# Patient Record
Sex: Male | Born: 1985 | Race: White | Hispanic: No | Marital: Married | Smoking: Never smoker
Health system: Southern US, Community
[De-identification: ages and names within clinical notes are randomized; demographics above are authoritative.]

## PROBLEM LIST (undated history)

## (undated) DIAGNOSIS — F419 Anxiety disorder, unspecified: Secondary | ICD-10-CM

## (undated) DIAGNOSIS — F32A Depression, unspecified: Secondary | ICD-10-CM

## (undated) DIAGNOSIS — F329 Major depressive disorder, single episode, unspecified: Secondary | ICD-10-CM

## (undated) HISTORY — DX: Anxiety disorder, unspecified: F41.9

## (undated) HISTORY — DX: Depression, unspecified: F32.A

## (undated) HISTORY — DX: Major depressive disorder, single episode, unspecified: F32.9

## (undated) HISTORY — PX: HERNIA REPAIR: SHX51

---

## 2014-08-19 ENCOUNTER — Emergency Department (HOSPITAL_COMMUNITY): Payer: BLUE CROSS/BLUE SHIELD

## 2014-08-19 ENCOUNTER — Encounter (HOSPITAL_COMMUNITY): Payer: Self-pay | Admitting: Emergency Medicine

## 2014-08-19 ENCOUNTER — Emergency Department (HOSPITAL_COMMUNITY)
Admission: EM | Admit: 2014-08-19 | Discharge: 2014-08-19 | Disposition: A | Discharge status: Home / Self Care | Payer: BLUE CROSS/BLUE SHIELD | Attending: Emergency Medicine | Admitting: Emergency Medicine

## 2014-08-19 DIAGNOSIS — Z7952 Long term (current) use of systemic steroids: Secondary | ICD-10-CM | POA: Diagnosis not present

## 2014-08-19 DIAGNOSIS — Z792 Long term (current) use of antibiotics: Secondary | ICD-10-CM | POA: Diagnosis not present

## 2014-08-19 DIAGNOSIS — J40 Bronchitis, not specified as acute or chronic: Secondary | ICD-10-CM | POA: Diagnosis not present

## 2014-08-19 DIAGNOSIS — Z79899 Other long term (current) drug therapy: Secondary | ICD-10-CM | POA: Diagnosis not present

## 2014-08-19 DIAGNOSIS — R0602 Shortness of breath: Secondary | ICD-10-CM | POA: Diagnosis present

## 2014-08-19 MED ORDER — ALBUTEROL SULFATE (2.5 MG/3ML) 0.083% IN NEBU
5.0000 mg | INHALATION_SOLUTION | Freq: Once | RESPIRATORY_TRACT | Status: AC
  Administered 2014-08-19: 5 mg via RESPIRATORY_TRACT
  Filled 2014-08-19: qty 6

## 2014-08-19 MED ORDER — NAPROXEN 500 MG PO TABS: 500.0000 mg | ORAL_TABLET | Freq: Two times a day (BID) | ORAL | Status: DC

## 2014-08-19 MED ORDER — ALBUTEROL SULFATE HFA 108 (90 BASE) MCG/ACT IN AERS
2.0000 | INHALATION_SPRAY | RESPIRATORY_TRACT | Status: DC
  Filled 2014-08-19: qty 6.7

## 2014-08-19 MED ORDER — IPRATROPIUM-ALBUTEROL 0.5-2.5 (3) MG/3ML IN SOLN
3.0000 mL | Freq: Once | RESPIRATORY_TRACT | Status: AC
  Administered 2014-08-19: 3 mL via RESPIRATORY_TRACT

## 2014-08-19 MED ORDER — HYDROCODONE-ACETAMINOPHEN 5-325 MG PO TABS
1.0000 | ORAL_TABLET | Freq: Once | ORAL | Status: AC
  Administered 2014-08-19: 1 via ORAL
  Filled 2014-08-19: qty 1

## 2014-08-19 NOTE — ED Provider Notes (Signed)
CSN: [id number]     Arrival date & time 08/19/14  0044 History   First MD Initiated Contact with Patient 08/19/14 0127     Chief Complaint  Patient presents with  . Shortness of Breath   (Consider location/radiation/quality/duration/timing/severity/associated sxs/prior Treatment) HPI  Jordan Hunt is a 29 yo male presenting with report of chest tightness and shortness of breath.  He states he was diagnosed with bronchitis last week after having cough and wheezing.  He was treated with prednisone and antibiotics but has continued to have wheezing worse with exercise and coughing worse at night.  He denies any fevers, chills, muscle aches, chest pain, diaphoresis, hemoptysis, recent surgery, or unilateral leg swelling or pain.   History reviewed. No pertinent past medical history. History reviewed. No pertinent past surgical history. No family history on file. History  Substance Use Topics  . Smoking status: Never Smoker   . Smokeless tobacco: Not on file  . Alcohol Use: No    Review of Systems  Constitutional: Negative for fever and chills.  HENT: Negative for sore throat.   Eyes: Negative for visual disturbance.  Respiratory: Positive for cough, chest tightness, shortness of breath and wheezing.   Cardiovascular: Negative for chest pain and leg swelling.  Gastrointestinal: Negative for nausea, vomiting and diarrhea.  Genitourinary: Negative for dysuria.  Musculoskeletal: Negative for myalgias.  Skin: Negative for rash.  Neurological: Negative for weakness, numbness and headaches.      Allergies  Review of patient's allergies indicates no known allergies.  Home Medications   Prior to Admission medications   Medication Sig Start Date End Date Taking? Authorizing Provider  Ascorbic Acid (VITAMIN C ER PO) Take 1 tablet by mouth daily.   Yes Historical Provider, MD  azithromycin (ZITHROMAX) 250 MG tablet Take 250 mg by mouth daily.   Yes Historical Provider, MD  Linoleic  Acid-Sunflower Oil (CLA PO) Take 1 tablet by mouth daily.   Yes Historical Provider, MD  Multiple Vitamin (MULTIVITAMIN WITH MINERALS) TABS tablet Take 1 tablet by mouth daily.   Yes Historical Provider, MD  Omega-3 Fatty Acids (FISH OIL EXTRA STRENGTH PO) Take 1 capsule by mouth daily.   Yes Historical Provider, MD  predniSONE (DELTASONE) 10 MG tablet Take 10 mg by mouth daily with breakfast.   Yes Historical Provider, MD   BP 123/78 mmHg  Pulse 57  Temp(Src) 97.6 F (36.4 C) (Oral)  Resp 18  Ht 5\' 8"  (1.727 m)  Wt 178 lb (80.74 kg)  BMI 27.07 kg/m2  SpO2 98% Physical Exam  Constitutional: He appears well-developed and well-nourished. No distress.  HENT:  Head: Normocephalic and atraumatic.  Mouth/Throat: Oropharynx is clear and moist.  Eyes: Conjunctivae are normal. Right eye exhibits no discharge. Left eye exhibits no discharge. No scleral icterus.  Neck: Neck supple. No thyromegaly present.  Cardiovascular: Normal rate, regular rhythm and intact distal pulses.   Pulmonary/Chest: Effort normal. No respiratory distress. He has no decreased breath sounds. He has wheezes in the left middle field and the left lower field. He has no rhonchi. He has no rales. He exhibits no tenderness.  Abdominal: Soft. There is no tenderness.  Musculoskeletal: He exhibits no tenderness.  Lymphadenopathy:    He has no cervical adenopathy.  Neurological: He is alert. Coordination normal.  Skin: Skin is warm and dry. No rash noted. He is not diaphoretic.  Psychiatric: He has a normal mood and affect.  Nursing note and vitals reviewed.   ED Course  Procedures (including critical  care time) Labs Review Labs Reviewed - No data to display  Imaging Review Dg Chest 2 View (if Patient Has Fever And/or Copd)  08/19/2014   CLINICAL DATA:  Labored breathing and chest pressure, onset tonight. Previously seen on 08/13/2014 for similar symptoms.  EXAM: CHEST  2 VIEW  COMPARISON:  None.  FINDINGS: The heart size  and mediastinal contours are within normal limits. Both lungs are clear. The visualized skeletal structures are unremarkable.  IMPRESSION: No active cardiopulmonary disease.   Electronically Signed   By: Burman NievesWilliam  Stevens M.D.   On: 08/19/2014 01:39     EKG Interpretation   Date/Time:  Monday Aug 19 2014 01:02:34 EDT Ventricular Rate:  58 PR Interval:  177 QRS Duration: 106 QT Interval:  429 QTC Calculation: 421 R Axis:   96 Text Interpretation:  Sinus rhythm Borderline right axis deviation Early  repolarization No old tracing to compare Confirmed by [facility] Healthcare[facility]  MD, DAVID  ([id number]) on 08/19/2014 1:05:41 AM      MDM   Final diagnoses:  Bronchitis   29 yo with symptoms consistent with bronchitis, likely viral as he has been treated with course of azithromycin with no significant improvement. He has no constitutional symptoms, but feels tightness in his chest worse at night. His CXR is negative for acute infiltrate. Pt will be discharged with Albuterol MDI with instructions to use 2 puffs every 4 hours as need for cough or wheezing.  Pt is well-appearing, in no acute distress and vital signs reviewed and not concerning. He appears safe to be discharged.  Discharge include resources to follow-up with a PCP.  Return precautions provided. Pt aware of plan and in agreement. Verbalizes understanding and is agreeable with plan.    Filed Vitals:   08/19/14 0050 08/19/14 0304  BP: 123/78 114/71  Pulse: 57 60  Temp: 97.6 F (36.4 C) 97.7 F (36.5 C)  TempSrc: Oral Oral  Resp: 18 18  Height: 5\' 8"  (1.727 m)   Weight: 178 lb (80.74 kg)   SpO2: 98% 95%   Meds given in ED:  Medications  ipratropium-albuterol (DUONEB) 0.5-2.5 (3) MG/3ML nebulizer solution 3 mL (3 mLs Nebulization Given 08/19/14 0107)  albuterol (PROVENTIL) (2.5 MG/3ML) 0.083% nebulizer solution 5 mg (5 mg Nebulization Given 08/19/14 0316)  HYDROcodone-acetaminophen (NORCO/VICODIN) 5-325 MG per tablet 1 tablet (1 tablet Oral Given  08/19/14 0315)    Discharge Medication List as of 08/19/2014  3:32 AM    START taking these medications   Details  naproxen (NAPROSYN) 500 MG tablet Take 1 tablet (500 mg total) by mouth 2 (two) times daily., Starting 08/19/2014, Until Discontinued, Print           Harle BattiestElizabeth Cipriano [place], NP 08/20/14 [id number]  Dione Boozeavid Glick, MD 08/20/14 [phone]

## 2014-08-19 NOTE — Discharge Instructions (Signed)
Please follow directions provided. Be sure to use the resource guide or the referral given to establish care with a primary care doctor. Please use your inhaler 2 puffs every 4 hours as needed for cough, wheezing. Use the naproxen twice a day to help with pain and inflammation. Don't hesitate to return for any new, worsening, or concerning symptoms.    SEEK IMMEDIATE MEDICAL CARE IF:  You feel little or no relief with your inhalers. You are still wheezing and feeling shortness of breath, tightness in your chest, or both.  You have dizziness, headaches, or a fast heart rate.  You have chills, fever, or night sweats.  There is a noticeable increase in phlegm production, or there is blood in the phlegm.   Emergency Department Resource Guide 1) Find a Doctor and Pay Out of Pocket Although you won't have to find out who is covered by your insurance plan, it is a good idea to ask around and get recommendations. You will then need to call the office and see if the doctor you have chosen will accept you as a new patient and what types of options they offer for patients who are self-pay. Some doctors offer discounts or will set up payment plans for their patients who do not have insurance, but you will need to ask so you aren't surprised when you get to your appointment.  2) Contact Your Local Health Department Not all health departments have doctors that can see patients for sick visits, but many do, so it is worth a call to see if yours does. If you don't know where your local health department is, you can check in your phone book. The CDC also has a tool to help you locate your state's health department, and many state websites also have listings of all of their local health departments.  3) Find a Walk-in Clinic If your illness is not likely to be very severe or complicated, you may want to try a walk in clinic. These are popping up all over the country in pharmacies, drugstores, and shopping centers.  They're usually staffed by nurse practitioners or physician assistants that have been trained to treat common illnesses and complaints. They're usually fairly quick and inexpensive. However, if you have serious medical issues or chronic medical problems, these are probably not your best option.  No Primary Care Doctor: - Call Health Connect at  [phone] - they can help you locate a primary care doctor that  accepts your insurance, provides certain services, etc. - Physician Referral Service- [phone]  Chronic Pain Problems: Organization         Address  Phone   Notes  [facility]  [phone] Patients need to be referred by their primary care doctor.   Medication Assistance: Organization         Address  Phone   Notes  [facility] [address] [place], [place] [id number] [phone] --Must be a resident of [place] -- Must have NO insurance coverage whatsoever (no Medicaid/ Medicare, etc.) -- The pt. MUST have a primary care doctor that directs their care regularly and follows them in the community   MedAssist  [phone]   [employer]  [phone]    Agencies that provide inexpensive medical care: Organization         Address  Phone   Notes  [facility]  [phone]   [facility]    (  [phone]   [facility] [place], [place] [address] [phone]   [place] [address] [place] [phone]   Planned Parenthood    [phone]   [facility]    [phone]   [place] and [address], [place] Phone:  [phone], Fax:  [phone] Hours of Operation:  9 am - 6 pm, M-F.  Also accepts Medicaid/Medicare and self-pay.  [facility] for [place], [address], [place] Phone: [phone], Fax: [phone]. Hours of Operation:  8:30 am - 5:30 pm, M-F.  Also accepts Medicaid and self-pay.  [facility] [place] [address], [place] Phone: [phone]   [place], [place], [place] [phone], Ext. [phone] Mondays & Thursdays: 7-9 AM.  First 15 patients are seen on a first come, first serve basis.    [place] Providers:  Organization         Address  Phone   Notes  [facility] [address], [place] [phone] Also accepts self-pay patients.  [facility] [id number] [place], [place]  [phone]   [place], [address], [place] [phone]   [facility] [address], [place] [phone]   Lucianne Lei [address], [place]   [phone] Only accepts [place] Access [place] patients after they have their name applied to their card.   Self-Pay (no insurance) in [facility]:  Organization         Address  Phone   Notes  Sickle Cell Patients, [facility] Magazine [phone]   [facility] [place] [phone]   [facility] [place]  [place], [place], [place] [phone]   [facility]/Dr. Osei-Bonsu  [address] [place] or [address], [place] [phone] Phone number for both [place] and [place] locations is the same.  Urgent Medical and [facility] [address], [place] [phone]   [facility] [address] [place] or [address] [phone]   [facility] [address], [place] [phone], phone; [phone], fax Sees patients 1st and 3rd Saturday of every month.  Must not qualify for public or private insurance (i.e.  Medicaid, Medicare, [place] Health Choice, Veterans' Benefits)  Household income should be no more than 200% of the poverty level The clinic cannot treat you if you are pregnant or think you are pregnant  Sexually transmitted diseases are not treated at the clinic.    Dental Care: Organization         Address  Phone  Notes  [facility] of [facility] [place] [phone] Accepts children up to age 54 who are enrolled in [place] or [place]; pregnant women with a Medicaid card; and children who have applied for Medicaid or  Health Choice, but were declined, whose parents can pay a reduced fee at time of service.  [facility] Department of [facility]  [address], [place] [phone] Accepts children up  to age 84 who are enrolled in Medicaid or [place] Health Choice; pregnant women with a Medicaid card; and children who have applied for Medicaid or [place] Health Choice, but were declined, whose parents can pay a reduced fee at time of service.  [place] Adult Dental Access PROGRAM  Fellows [phone] Patients are seen by appointment only. Walk-ins are not accepted. [place] will see patients 9 years of age and older. Monday - Tuesday (8am-5pm) Most Wednesdays (8:30-5pm) $30 per visit, cash only  [facility] Adult Dental Access PROGRAM  [address], [facility] [phone] Patients are seen by appointment only. Walk-ins are not accepted. [place] will see patients 65 years of age and older. One Wednesday Evening (Monthly: Volunteer Based).  $30 per visit, cash only  [place]  [phone] for adults; Children under age 30, call Graduate Pediatric Dentistry at [phone]. Children aged 55-14, please call [phone] to request a pediatric application.  Dental services are provided in all areas of dental care including fillings,  crowns and bridges, complete and partial dentures, implants, gum treatment, root canals, and extractions. Preventive care is also provided. Treatment is provided to both adults and children. Patients are selected via a lottery and there is often a waiting list.   [facility] [address] [place]  [phone] [email or web address]   [facility] [address] [place], [place] [phone], Ext. [phone] Second and Fourth Thursday of each month, opens at 6:30 AM; Clinic ends at 9 AM.  Patients are seen on a first-come first-served basis, and a limited number are seen during each clinic.   [facility]  [address] Danker [place], [place] [phone]   Eligibility Requirements You must have lived in [place], [place], or [place] counties for at least the last three months.   You cannot be eligible for state or federal sponsored [employer], including [employer], [place], or [employer].   You generally cannot be eligible for healthcare insurance through your employer.    How to apply: Eligibility screenings are held every Tuesday and Wednesday afternoon from 1:00 pm until 4:00 pm. You do not need an appointment for the interview!  [facility] [address] [place], [place]   [place]  Brecon Department  [place]  [phone]    Behavioral Health Resources in the Community: Intensive Outpatient Programs Organization         Address  Phone  Notes  [facility] [address] [place], [place] [phone]   [facility] Outpatient [address], [place], [place]   ADS: Alcohol & Drug Svcs [address], [place], [place]   [place] [address]  [place], [place] or [phone]   Substance Abuse  Resources Organization         Address  Phone  Notes  Alcohol and Drug Services  [phone]   [place]  [phone]   [facility] [place]   Chinita Pester  [phone]   Residential & Outpatient Substance Abuse Program  [phone]   Psychological Services Organization         Address  Phone  Notes  [place]  [place]  [place]   [place]  [address] [place] or [phone]    Mobile Crisis Teams Organization         Address  Phone  Notes  [facility], Mobile Crisis Care Unit  [phone]   [facility]  [address] [place], [place]   [place] Levels [address] [place] [phone]    Self-Help/Support Groups Organization         Address  Phone             Notes  [place]. of [place] - variety of support groups  [place] Call for more information  Narcotics Anonymous (NA), [facility] [address], [employer] [place]  2 meetings at this location   Special educational needs teacher         Address  Phone  Notes  [facility] Residential Treatment [place],    [place]  [phone]   [facility]  [address] [place] [phone], [place], [place]   [place], [place] [phone] Admissions: 8am-3pm M-F  Incentives Substance [place] [address],    [place], [place] [phone]   [facility] [address] [place], [place], [place]   [facility] [address],  [place], [place]   [facility] Programs - Intensive Outpatient [address], [place], [place] Walk   [facility] ([place] [address] [place].,  [place], [place] [phone] or [phone]   [facility] (RTS) [address] [place], [place] Accepts Medicaid  [facility] [place] [address],  [place] [phone] Substance Abuse/Addiction Treatment   [facility] Organization         Address  Phone  Notes  [facility]  [phone]   Domenic Schwab, PhD [address] Arlis [place], [place]   [phone] or [phone]   [facility] [place], [place] [phone]   [facility] [address] [place], [place] [phone] Insurance/Medicaid/sponsorship through [facility] and Families [address]                                    [place], [place] [phone] [place] [address], [place] [phone]    Dr. Adele Schilder  [phone]   [facility] Dept. 1) [address], [place] 2) [place] 3)  [place] [address], [place] [phone]  [phone]   [place] [phone] or [phone] (After Hours)

## 2014-08-19 NOTE — ED Notes (Addendum)
Pt from home c/o shortness of breath x 1 week.  He was seen at PCP and given antibiotics and prednisone for 4 days. Pt has expiratory wheeze on left, clear right. Pt NAD.

## 2015-04-09 ENCOUNTER — Telehealth: Payer: Self-pay | Admitting: General Practice

## 2015-04-09 NOTE — Telephone Encounter (Signed)
Spoke with patient at length about ongoing symptoms. Patient states that he is sure that he is having panic attacks and suffered with this in the past from 2008 through 2013 when he took cymbalta. He states he got better and was able to come off the meds. Flared up again on christmas eve and he went to the ER. Advised patient that we would see in the am and if symptoms worsen overnight or he begins to have chest pain to go to the nearest ER.

## 2015-04-10 ENCOUNTER — Telehealth: Payer: Self-pay | Admitting: Family Medicine

## 2015-04-10 ENCOUNTER — Encounter: Payer: Self-pay | Admitting: Family Medicine

## 2015-04-10 ENCOUNTER — Ambulatory Visit (INDEPENDENT_AMBULATORY_CARE_PROVIDER_SITE_OTHER): Payer: BLUE CROSS/BLUE SHIELD | Admitting: Family Medicine

## 2015-04-10 VITALS — BP 131/86 | HR 65 | Temp 97.1°F | Ht 68.0 in | Wt 177.8 lb

## 2015-04-10 DIAGNOSIS — F419 Anxiety disorder, unspecified: Principal | ICD-10-CM

## 2015-04-10 DIAGNOSIS — F418 Other specified anxiety disorders: Secondary | ICD-10-CM | POA: Diagnosis not present

## 2015-04-10 DIAGNOSIS — F329 Major depressive disorder, single episode, unspecified: Secondary | ICD-10-CM

## 2015-04-10 MED ORDER — DULOXETINE HCL 60 MG PO CPEP: 60.0000 mg | ORAL_CAPSULE | Freq: Every day | ORAL | Status: DC

## 2015-04-10 MED ORDER — BUSPIRONE HCL 7.5 MG PO TABS: 7.5000 mg | ORAL_TABLET | Freq: Two times a day (BID) | ORAL | Status: DC

## 2015-04-10 MED ORDER — TRAZODONE HCL 50 MG PO TABS: 25.0000 mg | ORAL_TABLET | Freq: Every evening | ORAL | Status: DC | PRN

## 2015-04-10 NOTE — Progress Notes (Signed)
BP 131/86 mmHg  Pulse 65  Temp(Src) 97.1 F (36.2 C) (Oral)  Ht 5' 8"  (1.727 m)  Wt 177 lb 12.8 oz (80.65 kg)  BMI 27.04 kg/m2   Subjective:    Patient ID: Jordan Hunt, male    DOB: [birth year], 29 y.o.   MRN: [id number]  HPI: Jordan Hunt is a 29 y.o. male presenting on 04/10/2015 for Establish Care   HPI Anxiety depression Patient presents today as a new patient to establish care with our practice and to talk about his anxiety and depression. He admits that he had anxiety and depression is been on medications before. What worked for him before was Cymbalta. He said is been feeling kind of off for the past 2 months and just not himself with more crying and lack of desire and decreased appetite. This culminated over her cyst break when he was up at his parent's house in [place] and he had a panic attack where he thought he was having a heart attack and had to go to the emergency department. Since that time he has been having a lot of issues with anxiety but no panic attacks but he is not able to sleep at night as well. He denies any suicidal ideations or thoughts of hurting himself.  Relevant past medical, surgical, family and social history reviewed and updated as indicated. Interim medical history since our last visit reviewed. Allergies and medications reviewed and updated.  Review of Systems  Constitutional: Negative for fever and chills.  HENT: Negative for ear discharge and ear pain.   Eyes: Negative for discharge and visual disturbance.  Respiratory: Negative for shortness of breath and wheezing.   Cardiovascular: Negative for chest pain and leg swelling.  Gastrointestinal: Negative for abdominal pain, diarrhea and constipation.  Endocrine: Negative for cold intolerance and heat intolerance.  Genitourinary: Negative for difficulty urinating.  Musculoskeletal: Negative for back pain and gait problem.  Skin: Negative for rash.  Neurological: Negative for syncope,  light-headedness and headaches.  Psychiatric/Behavioral: Positive for sleep disturbance, dysphoric mood and decreased concentration. Negative for suicidal ideas, confusion and self-injury. The patient is nervous/anxious.   All other systems reviewed and are negative.   Per HPI unless specifically indicated above     Medication List       This list is accurate as of: 04/10/15 10:09 AM.  Always use your most recent med list.               DULoxetine 60 MG capsule  Commonly known as:  CYMBALTA  Take 1 capsule (60 mg total) by mouth daily.     traZODone 50 MG tablet  Commonly known as:  DESYREL  Take 0.5-1 tablets (25-50 mg total) by mouth at bedtime as needed for sleep.       Past Surgical History  Procedure Laterality Date  . Hernia repair  [id number]   Social History   Social History  . Marital Status: Married    Spouse Name: N/A  . Number of Children: N/A  . Years of Education: N/A   Occupational History  . Not on file.   Social History Main Topics  . Smoking status: Never Smoker   . Smokeless tobacco: Never Used  . Alcohol Use: No  . Drug Use: No  . Sexual Activity: Yes   Other Topics Concern  . Not on file   Social History Narrative        Objective:    BP 131/86 mmHg  Pulse 65  Temp(Src)  97.1 F (36.2 C) (Oral)  Ht 5' 8"  (1.727 m)  Wt 177 lb 12.8 oz (80.65 kg)  BMI 27.04 kg/m2  Wt Readings from Last 3 Encounters:  04/10/15 177 lb 12.8 oz (80.65 kg)  08/19/14 178 lb (80.74 kg)    Physical Exam  Constitutional: He is oriented to person, place, and time. He appears well-developed and well-nourished. No distress.  Eyes: Conjunctivae and EOM are normal. Pupils are equal, round, and reactive to light. Right eye exhibits no discharge. No scleral icterus.  Cardiovascular: Normal rate, regular rhythm, normal heart sounds and intact distal pulses.   No murmur heard. Pulmonary/Chest: Effort normal and breath sounds normal. No respiratory distress. He  has no wheezes.  Musculoskeletal: Normal range of motion. He exhibits no edema.  Neurological: He is alert and oriented to person, place, and time. Coordination normal.  Skin: Skin is warm and dry. No rash noted. He is not diaphoretic.  Psychiatric: His speech is normal and behavior is normal. Judgment and thought content normal. His mood appears anxious. His affect is labile. His affect is not angry and not inappropriate. He exhibits a depressed mood. He expresses no suicidal ideation. He expresses no suicidal plans.  Vitals reviewed.   No results found for this or any previous visit.    Assessment & Plan:   Problem List Items Addressed This Visit      Other   Anxiety and depression - Primary   Relevant Medications   DULoxetine (CYMBALTA) 60 MG capsule   traZODone (DESYREL) 50 MG tablet   Other Relevant Orders   BMP8+EGFR   CBC with Differential/Platelet   TSH       Follow up plan: Return in about 4 weeks (around 05/08/2015), or if symptoms worsen or fail to improve, for Anxiety and depression.  Counseling provided for all of the vaccine components Orders Placed This Encounter  Procedures  . BMP8+EGFR  . CBC with Differential/Platelet  . TSH    Caryl Pina, MD [facility] 04/10/2015, 10:09 AM

## 2015-04-10 NOTE — Telephone Encounter (Signed)
Call in Buspirone 7.5 milligrams twice a day as needed. Arville CareJoshua Dettinger, MD [facility] 04/10/2015, 2:56 PM

## 2015-04-10 NOTE — Telephone Encounter (Signed)
Patient aware and rx called into pharmacy.  

## 2015-04-11 LAB — CBC WITH DIFFERENTIAL/PLATELET
Basophils Absolute: 0 10*3/uL (ref 0.0–0.2)
Basos: 0 %
EOS (ABSOLUTE): 0 10*3/uL (ref 0.0–0.4)
EOS: 0 %
HEMOGLOBIN: 16.7 g/dL (ref 12.6–17.7)
Hematocrit: 46.8 % (ref 37.5–51.0)
Immature Grans (Abs): 0 10*3/uL (ref 0.0–0.1)
Immature Granulocytes: 0 %
LYMPHS ABS: 1.7 10*3/uL (ref 0.7–3.1)
LYMPHS: 21 %
MCH: 29.6 pg (ref 26.6–33.0)
MCHC: 35.7 g/dL (ref 31.5–35.7)
MCV: 83 fL (ref 79–97)
MONOCYTES: 5 %
Monocytes Absolute: 0.4 10*3/uL (ref 0.1–0.9)
Neutrophils Absolute: 5.9 10*3/uL (ref 1.4–7.0)
Neutrophils: 74 %
Platelets: 234 10*3/uL (ref 150–379)
RBC: 5.64 x10E6/uL (ref 4.14–5.80)
RDW: 13.3 % (ref 12.3–15.4)
WBC: 8 10*3/uL (ref 3.4–10.8)

## 2015-04-11 LAB — BMP8+EGFR
BUN / CREAT RATIO: 11 (ref 8–19)
BUN: 12 mg/dL (ref 6–20)
CALCIUM: 9.6 mg/dL (ref 8.7–10.2)
CHLORIDE: 101 mmol/L (ref 96–106)
CO2: 25 mmol/L (ref 18–29)
CREATININE: 1.06 mg/dL (ref 0.76–1.27)
GFR calc Af Amer: 109 mL/min/{1.73_m2} (ref >59)
GFR calc non Af Amer: 94 mL/min/{1.73_m2} (ref >59)
Glucose: 89 mg/dL (ref 65–99)
Potassium: 4.7 mmol/L (ref 3.5–5.2)
Sodium: 141 mmol/L (ref 134–144)

## 2015-04-11 LAB — TSH: TSH: 1.69 u[IU]/mL (ref 0.450–4.500)

## 2015-05-07 ENCOUNTER — Encounter: Payer: Self-pay | Admitting: Family Medicine

## 2015-05-07 ENCOUNTER — Ambulatory Visit (INDEPENDENT_AMBULATORY_CARE_PROVIDER_SITE_OTHER): Payer: BLUE CROSS/BLUE SHIELD | Admitting: Family Medicine

## 2015-05-07 VITALS — BP 136/81 | HR 70 | Temp 98.0°F | Ht 68.0 in | Wt 178.0 lb

## 2015-05-07 DIAGNOSIS — Z23 Encounter for immunization: Secondary | ICD-10-CM | POA: Diagnosis not present

## 2015-05-07 DIAGNOSIS — F418 Other specified anxiety disorders: Secondary | ICD-10-CM | POA: Diagnosis not present

## 2015-05-07 DIAGNOSIS — F329 Major depressive disorder, single episode, unspecified: Secondary | ICD-10-CM

## 2015-05-07 DIAGNOSIS — F32A Depression, unspecified: Secondary | ICD-10-CM

## 2015-05-07 DIAGNOSIS — F419 Anxiety disorder, unspecified: Principal | ICD-10-CM

## 2015-05-07 MED ORDER — DULOXETINE HCL 60 MG PO CPEP: 60.0000 mg | ORAL_CAPSULE | Freq: Every day | ORAL | Status: DC

## 2015-05-07 MED ORDER — TRAZODONE HCL 50 MG PO TABS: 25.0000 mg | ORAL_TABLET | Freq: Every evening | ORAL | Status: DC | PRN

## 2015-05-07 NOTE — Progress Notes (Signed)
BP 136/81 mmHg  Pulse 70  Temp(Src) 98 F (36.7 C) (Oral)  Ht _0  (1.727 m)  Wt 178 lb (80.74 kg)  BMI 27.07 kg/m2   Subjective:    Patient ID: Jordan Hunt, male    DOB: November 26, 1985, 30 y.o.   MRN: [id number]  HPI: Jordan Hunt is a 30 y.o. male presenting on 05/07/2015 for Anxiety & Depression followup   HPI Follow-up anxiety and depression Patient is coming in today for a 4 week checkup on his anxiety and depression. We started him on Cymbalta which she had been on previously before in his life 4 weeks ago. Started him on some trazodone and buspirone to get him through the initial stage with sleeping and anxiety. He feels he is doing a lot better that he is 10 out of 10 today. He is back to exercising he is sleeping better and having more energy to get up and do things on daily basis. He is having some GI upset with the medication but that is improving over the past couple weeks.  Relevant past medical, surgical, family and social history reviewed and updated as indicated. Interim medical history since our last visit reviewed. Allergies and medications reviewed and updated.  Review of Systems  Constitutional: Negative for fever and chills.  HENT: Negative for ear discharge and ear pain.   Eyes: Negative for discharge and visual disturbance.  Respiratory: Negative for shortness of breath and wheezing.   Cardiovascular: Negative for chest pain and leg swelling.  Gastrointestinal: Negative for abdominal pain, diarrhea and constipation.  Genitourinary: Negative for difficulty urinating.  Musculoskeletal: Negative for back pain and gait problem.  Skin: Negative for rash.  Neurological: Negative for dizziness, syncope, light-headedness and headaches.  Psychiatric/Behavioral: Positive for dysphoric mood (Much less frequent). Negative for suicidal ideas, behavioral problems, sleep disturbance and self-injury. The patient is nervous/anxious.   All other systems reviewed and are  negative.   Per HPI unless specifically indicated above     Medication List       This list is accurate as of: 05/07/15  9:01 AM.  Always use your most recent med list.               DULoxetine 60 MG capsule  Commonly known as:  CYMBALTA  Take 1 capsule (60 mg total) by mouth daily.     traZODone 50 MG tablet  Commonly known as:  DESYREL  Take 0.5-1 tablets (25-50 mg total) by mouth at bedtime as needed for sleep.           Objective:    BP 136/81 mmHg  Pulse 70  Temp(Src) 98 F (36.7 C) (Oral)  Ht _1  (1.727 m)  Wt 178 lb (80.74 kg)  BMI 27.07 kg/m2  Wt Readings from Last 3 Encounters:  05/07/15 178 lb (80.74 kg)  04/10/15 177 lb 12.8 oz (80.65 kg)  08/19/14 178 lb (80.74 kg)    Physical Exam  Constitutional: He is oriented to person, place, and time. He appears well-developed and well-nourished. No distress.  Eyes: Conjunctivae and EOM are normal. Pupils are equal, round, and reactive to light. Right eye exhibits no discharge. No scleral icterus.  Cardiovascular: Normal rate, regular rhythm, normal heart sounds and intact distal pulses.   No murmur heard. Pulmonary/Chest: Effort normal and breath sounds normal. No respiratory distress. He has no wheezes.  Musculoskeletal: Normal range of motion. He exhibits no edema.  Neurological: He is alert and oriented to person, place, and time.  Coordination normal.  Skin: Skin is warm and dry. No rash noted. He is not diaphoretic.  Psychiatric: His behavior is normal. Judgment and thought content normal. His mood appears anxious. He does not exhibit a depressed mood. He expresses no suicidal ideation. He expresses no suicidal plans.  Nursing note and vitals reviewed.   Results for orders placed or performed in visit on 04/10/15  [facility]  Result Value Ref Range   Glucose 89 65 - 99 mg/dL   BUN 12 6 - 20 mg/dL   Creatinine, Ser 1.06 0.76 - 1.27 mg/dL   GFR calc non Af Amer 94 >59 mL/min/1.73   GFR calc Af Amer  109 >59 mL/min/1.73   BUN/Creatinine Ratio 11 8 - 19   Sodium 141 134 - 144 mmol/L   Potassium 4.7 3.5 - 5.2 mmol/L   Chloride 101 96 - 106 mmol/L   CO2 25 18 - 29 mmol/L   Calcium 9.6 8.7 - 10.2 mg/dL  CBC with Differential/Platelet  Result Value Ref Range   WBC 8.0 3.4 - 10.8 x10E3/uL   RBC 5.64 4.14 - 5.80 x10E6/uL   Hemoglobin 16.7 12.6 - 17.7 g/dL   Hematocrit 46.8 37.5 - 51.0 %   MCV 83 79 - 97 fL   MCH 29.6 26.6 - 33.0 pg   MCHC 35.7 31.5 - 35.7 g/dL   RDW 13.3 12.3 - 15.4 %   Platelets 234 150 - 379 x10E3/uL   Neutrophils 74 %   Lymphs 21 %   Monocytes 5 %   Eos 0 %   Basos 0 %   Neutrophils Absolute 5.9 1.4 - 7.0 x10E3/uL   Lymphocytes Absolute 1.7 0.7 - 3.1 x10E3/uL   Monocytes Absolute 0.4 0.1 - 0.9 x10E3/uL   EOS (ABSOLUTE) 0.0 0.0 - 0.4 x10E3/uL   Basophils Absolute 0.0 0.0 - 0.2 x10E3/uL   Immature Granulocytes 0 %   Immature Grans (Abs) 0.0 0.0 - 0.1 x10E3/uL  TSH  Result Value Ref Range   TSH 1.690 0.450 - 4.500 uIU/mL      Assessment & Plan:   Problem List Items Addressed This Visit      Other   Anxiety and depression - Primary   Relevant Medications   DULoxetine (CYMBALTA) 60 MG capsule   traZODone (DESYREL) 50 MG tablet    Other Visit Diagnoses    Need for Tdap vaccination        Relevant Orders    Tdap vaccine greater than or equal to 7yo IM        Follow up plan: Return in about 3 months (around 08/05/2015), or if symptoms worsen or fail to improve, for Follow-up depression and anxiety.  Counseling provided for all of the vaccine components No orders of the defined types were placed in this encounter.    Caryl Pina, MD [facility] 05/07/2015, 9:01 AM

## 2015-07-31 ENCOUNTER — Encounter (INDEPENDENT_AMBULATORY_CARE_PROVIDER_SITE_OTHER): Payer: Self-pay

## 2015-07-31 ENCOUNTER — Ambulatory Visit (INDEPENDENT_AMBULATORY_CARE_PROVIDER_SITE_OTHER): Payer: BLUE CROSS/BLUE SHIELD | Admitting: Family Medicine

## 2015-07-31 ENCOUNTER — Encounter: Payer: Self-pay | Admitting: Family Medicine

## 2015-07-31 VITALS — BP 134/88 | HR 80 | Temp 97.2°F | Ht 68.0 in | Wt 175.4 lb

## 2015-07-31 DIAGNOSIS — F32A Depression, unspecified: Secondary | ICD-10-CM

## 2015-07-31 DIAGNOSIS — F418 Other specified anxiety disorders: Secondary | ICD-10-CM | POA: Diagnosis not present

## 2015-07-31 DIAGNOSIS — F419 Anxiety disorder, unspecified: Principal | ICD-10-CM

## 2015-07-31 DIAGNOSIS — F329 Major depressive disorder, single episode, unspecified: Secondary | ICD-10-CM

## 2015-07-31 MED ORDER — DULOXETINE HCL 60 MG PO CPEP: 60.0000 mg | ORAL_CAPSULE | Freq: Every day | ORAL | Status: DC

## 2015-07-31 MED ORDER — BUSPIRONE HCL 7.5 MG PO TABS: 7.5000 mg | ORAL_TABLET | Freq: Two times a day (BID) | ORAL | Status: DC

## 2015-07-31 NOTE — Progress Notes (Signed)
BP 134/88 mmHg  Pulse 80  Temp(Src) 97.2 F (36.2 C) (Oral)  Ht  (1.727 m)  Wt 175 lb 6.4 oz (79.561 kg)  BMI 26.68 kg/m2   Subjective:    Patient ID: Jordan Hunt, male    DOB: 12-21-1985, 30 y.o.   MRN: [id number]  HPI: Jordan Hunt is a 30 y.o. male presenting on 07/31/2015 for Anxiety & Depression   HPI Anxiety and depression recheck Patient is coming in today for anxiety and depression recheck. He felt like he was doing really well and then over the past 3 months he had his child and it came slightly premature and that is also working through school and had some changes at his job as well. Because of those changes he actually started using his BuSpar which he had from before twice a day and he feels like he is doing very well over the past few days since he started that about 5 days ago. He denies any suicidal ideations or thoughts of hurting himself. He is sleeping at night when the baby is not waking him up. He hasn't felt as much depressed as he has felt anxious.  Relevant past medical, surgical, family and social history reviewed and updated as indicated. Interim medical history since our last visit reviewed. Allergies and medications reviewed and updated.  Review of Systems  Constitutional: Negative for fever.  HENT: Negative for ear discharge and ear pain.   Eyes: Negative for discharge and visual disturbance.  Respiratory: Negative for shortness of breath and wheezing.   Cardiovascular: Negative for chest pain and leg swelling.  Gastrointestinal: Negative for abdominal pain, diarrhea and constipation.  Genitourinary: Negative for difficulty urinating.  Musculoskeletal: Negative for back pain and gait problem.  Skin: Negative for rash.  Neurological: Negative for syncope, light-headedness and headaches.  Psychiatric/Behavioral: Positive for dysphoric mood. Negative for suicidal ideas, behavioral problems, confusion, sleep disturbance, self-injury, decreased  concentration and agitation. The patient is not nervous/anxious.   All other systems reviewed and are negative.   Per HPI unless specifically indicated above     Medication List       This list is accurate as of: 07/31/15  1:44 PM.  Always use your most recent med list.               busPIRone 7.5 MG tablet  Commonly known as:  BUSPAR  Take 1 tablet (7.5 mg total) by mouth 2 (two) times daily.     DULoxetine 60 MG capsule  Commonly known as:  CYMBALTA  Take 1 capsule (60 mg total) by mouth daily.     traZODone 50 MG tablet  Commonly known as:  DESYREL  Take 0.5-1 tablets (25-50 mg total) by mouth at bedtime as needed for sleep.           Objective:    BP 134/88 mmHg  Pulse 80  Temp(Src) 97.2 F (36.2 C) (Oral)  Ht  (1.727 m)  Wt 175 lb 6.4 oz (79.561 kg)  BMI 26.68 kg/m2  Wt Readings from Last 3 Encounters:  07/31/15 175 lb 6.4 oz (79.561 kg)  05/07/15 178 lb (80.74 kg)  04/10/15 177 lb 12.8 oz (80.65 kg)    Physical Exam  Constitutional: He is oriented to person, place, and time. He appears well-developed and well-nourished. No distress.  Eyes: Conjunctivae and EOM are normal. Pupils are equal, round, and reactive to light. Right eye exhibits no discharge. No scleral icterus.  Cardiovascular: Normal rate, regular rhythm,  normal heart sounds and intact distal pulses.   No murmur heard. Pulmonary/Chest: Effort normal and breath sounds normal. No respiratory distress. He has no wheezes.  Musculoskeletal: Normal range of motion. He exhibits no edema.  Neurological: He is alert and oriented to person, place, and time. Coordination normal.  Skin: Skin is warm and dry. No rash noted. He is not diaphoretic.  Psychiatric: His behavior is normal. Judgment and thought content normal. His mood appears anxious. He exhibits a depressed mood. He expresses no suicidal ideation. He expresses no suicidal plans.  Vitals reviewed.     Assessment & Plan:   Problem List  Items Addressed This Visit      Other   Anxiety and depression - Primary   Relevant Medications   busPIRone (BUSPAR) 7.5 MG tablet   DULoxetine (CYMBALTA) 60 MG capsule      Follow up plan: Return in about 3 months (around 10/30/2015), or if symptoms worsen or fail to improve, for Follow-up anxiety.  Counseling provided for all of the vaccine components No orders of the defined types were placed in this encounter.    Arville CareJoshua Rudie Sermons, MD [facility] 07/31/2015, 1:44 PM

## 2015-08-08 ENCOUNTER — Ambulatory Visit: Payer: BLUE CROSS/BLUE SHIELD | Admitting: Family Medicine

## 2015-08-15 ENCOUNTER — Ambulatory Visit (INDEPENDENT_AMBULATORY_CARE_PROVIDER_SITE_OTHER): Payer: BLUE CROSS/BLUE SHIELD | Admitting: Family Medicine

## 2015-08-15 ENCOUNTER — Encounter: Payer: Self-pay | Admitting: Family Medicine

## 2015-08-15 VITALS — BP 117/81 | HR 78 | Temp 98.2°F | Ht 68.0 in | Wt 173.8 lb

## 2015-08-15 DIAGNOSIS — F418 Other specified anxiety disorders: Secondary | ICD-10-CM

## 2015-08-15 DIAGNOSIS — F419 Anxiety disorder, unspecified: Principal | ICD-10-CM

## 2015-08-15 DIAGNOSIS — F329 Major depressive disorder, single episode, unspecified: Secondary | ICD-10-CM

## 2015-08-15 MED ORDER — DULOXETINE HCL 30 MG PO CPEP: 30.0000 mg | ORAL_CAPSULE | Freq: Every day | ORAL | Status: DC

## 2015-08-15 NOTE — Assessment & Plan Note (Signed)
Was feeling better but now is still having to 3 days a week where he is just anxious and down but is having a lot more good days. Will increase Cymbalta

## 2015-08-15 NOTE — Progress Notes (Signed)
BP 117/81 mmHg  Pulse 78  Temp(Src) 98.2 F (36.8 C) (Oral)  Ht  (1.727 m)  Wt 173 lb 12.8 oz (78.835 kg)  BMI 26.43 kg/m2   Subjective:    Patient ID: Jordan Hunt, male    DOB: [birth year], 30 y.o.   MRN: [id number]  HPI: Jordan Hunt is a 30 y.o. male presenting on 08/15/2015 for Anxiety   HPI Anxiety and depression Patient is coming in for anxiety and depression recheck. He says he is doing very well most days taking the Cymbalta and BuSpar but about 2-3 days a week he just feels down and he feels like he is in a "funk". He denies any suicidal ideations or thoughts of hurting himself. He does admit to having some thoughts of death but being there for his wife and his daughter is what keeps him going. He has a Veterinary surgeon that is seen is talking through things. He feels like the Cymbalta has helped him but just feels like he is not completely there. He also still feels like the BuSpar is helping some and would like to continue it. He is able to fall asleep at night but does have some nights where he gets up because of his anxiety and is not able to fall back asleep.  Relevant past medical, surgical, family and social history reviewed and updated as indicated. Interim medical history since our last visit reviewed. Allergies and medications reviewed and updated.  Review of Systems  Constitutional: Negative for fever.  HENT: Negative for ear discharge and ear pain.   Eyes: Negative for discharge and visual disturbance.  Respiratory: Negative for shortness of breath and wheezing.   Cardiovascular: Negative for chest pain and leg swelling.  Gastrointestinal: Negative for abdominal pain, diarrhea and constipation.  Genitourinary: Negative for difficulty urinating.  Musculoskeletal: Negative for back pain and gait problem.  Skin: Negative for rash.  Neurological: Negative for syncope, light-headedness and headaches.  Psychiatric/Behavioral: Positive for sleep disturbance and  dysphoric mood. Negative for suicidal ideas, self-injury and decreased concentration. The patient is nervous/anxious.   All other systems reviewed and are negative.   Per HPI unless specifically indicated above     Medication List       This list is accurate as of: 08/15/15  8:33 AM.  Always use your most recent med list.               busPIRone 7.5 MG tablet  Commonly known as:  BUSPAR  Take 1 tablet (7.5 mg total) by mouth 2 (two) times daily.     DULoxetine 60 MG capsule  Commonly known as:  CYMBALTA  Take 1 capsule (60 mg total) by mouth daily.     DULoxetine 30 MG capsule  Commonly known as:  CYMBALTA  Take 1 capsule (30 mg total) by mouth daily.     traZODone 50 MG tablet  Commonly known as:  DESYREL  Take 0.5-1 tablets (25-50 mg total) by mouth at bedtime as needed for sleep.           Objective:    BP 117/81 mmHg  Pulse 78  Temp(Src) 98.2 F (36.8 C) (Oral)  Ht  (1.727 m)  Wt 173 lb 12.8 oz (78.835 kg)  BMI 26.43 kg/m2  Wt Readings from Last 3 Encounters:  08/15/15 173 lb 12.8 oz (78.835 kg)  07/31/15 175 lb 6.4 oz (79.561 kg)  05/07/15 178 lb (80.74 kg)    Physical Exam  Constitutional: He is oriented  to person, place, and time. He appears well-developed and well-nourished. No distress.  Eyes: Conjunctivae and EOM are normal. Pupils are equal, round, and reactive to light. Right eye exhibits no discharge. No scleral icterus.  Neck: Neck supple. No thyromegaly present.  Cardiovascular: Normal rate, regular rhythm, normal heart sounds and intact distal pulses.   No murmur heard. Pulmonary/Chest: Effort normal and breath sounds normal. No respiratory distress. He has no wheezes.  Musculoskeletal: Normal range of motion. He exhibits no edema.  Lymphadenopathy:    He has no cervical adenopathy.  Neurological: He is alert and oriented to person, place, and time. Coordination normal.  Skin: Skin is warm and dry. No rash noted. He is not diaphoretic.    Psychiatric: His behavior is normal. Judgment and thought content normal. His mood appears anxious. He exhibits a depressed mood. He expresses no suicidal ideation. He expresses no suicidal plans.  Vitals reviewed.      Assessment & Plan:   Problem List Items Addressed This Visit      Other   Anxiety and depression - Primary    Was feeling better but now is still having to 3 days a week where he is just anxious and down but is having a lot more good days. Will increase Cymbalta          Follow up plan: Return in about 4 weeks (around 09/12/2015), or if symptoms worsen or fail to improve, for Recheck anxiety and depression.  Counseling provided for all of the vaccine components No orders of the defined types were placed in this encounter.    Arville CareJoshua Beverlyann Broxterman, MD [facility] 08/15/2015, 8:33 AM

## 2015-09-17 ENCOUNTER — Ambulatory Visit (INDEPENDENT_AMBULATORY_CARE_PROVIDER_SITE_OTHER): Payer: BLUE CROSS/BLUE SHIELD | Admitting: Family Medicine

## 2015-09-17 ENCOUNTER — Encounter: Payer: Self-pay | Admitting: Family Medicine

## 2015-09-17 VITALS — BP 133/88 | HR 74 | Temp 98.7°F | Ht 68.0 in | Wt 177.6 lb

## 2015-09-17 DIAGNOSIS — F418 Other specified anxiety disorders: Secondary | ICD-10-CM | POA: Diagnosis not present

## 2015-09-17 DIAGNOSIS — F419 Anxiety disorder, unspecified: Principal | ICD-10-CM

## 2015-09-17 DIAGNOSIS — F329 Major depressive disorder, single episode, unspecified: Secondary | ICD-10-CM

## 2015-09-17 MED ORDER — VENLAFAXINE HCL ER 150 MG PO CP24: 150.0000 mg | ORAL_CAPSULE | Freq: Every day | ORAL | Status: DC

## 2015-09-17 NOTE — Progress Notes (Signed)
BP 133/88 mmHg  Pulse 74  Temp(Src) 98.7 F (37.1 C) (Oral)  Ht  (1.727 m)  Wt 177 lb 9.6 oz (80.559 kg)  BMI 27.01 kg/m2   Subjective:    Patient ID: Jordan Hunt, male    DOB: 12-11-1985, 30 y.o.   MRN: [id number]  HPI: Jordan Hunt is a 30 y.o. male presenting on 09/17/2015 for Anxiety & Depression   HPI Anxiety and depression Patient is coming in today for anxiety and depression recheck. He still says about once a week he is having the symptoms of being overwhelmed and a motivated and just wanting to sit down and cry. This is less than he was having at the previous visit as it was 3 times a week previously. He denies any thoughts of suicide or thoughts of hurting himself. His stressors of note are a new infant that is a little bit colicky currently. 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 He is starting to have more motivation to go and work out in the mornings like he used to and he is also having more motivation to do other things that he likes to do.  Relevant past medical, surgical, family and social history reviewed and updated as indicated. Interim medical history since our last visit reviewed. Allergies and medications reviewed and updated.  Review of Systems  Constitutional: Negative for fever.  HENT: Negative for ear discharge and ear pain.   Eyes: Negative for discharge and visual disturbance.  Respiratory: Negative for shortness of breath and wheezing.   Cardiovascular: Negative for chest pain and leg swelling.  Gastrointestinal: Negative for abdominal pain, diarrhea and constipation.  Genitourinary: Negative for difficulty urinating.  Musculoskeletal: Negative for back pain and gait problem.  Skin: Negative for rash.  Neurological: Negative for syncope, light-headedness and headaches.  Psychiatric/Behavioral: Positive for sleep disturbance and  dysphoric mood. Negative for suicidal ideas, self-injury and decreased concentration. The patient is nervous/anxious.   All other systems reviewed and are negative.   Per HPI unless specifically indicated above     Medication List       This list is accurate as of: 09/17/15 12:41 PM.  Always use your most recent med list.               busPIRone 7.5 MG tablet  Commonly known as:  BUSPAR  Take 1 tablet (7.5 mg total) by mouth 2 (two) times daily.     traZODone 50 MG tablet  Commonly known as:  DESYREL  Take 0.5-1 tablets (25-50 mg total) by mouth at bedtime as needed for sleep.     venlafaxine XR 150 MG 24 hr capsule  Commonly known as:  EFFEXOR-XR  Take 1 capsule (150 mg total) by mouth daily with breakfast.           Objective:    BP 133/88 mmHg  Pulse 74  Temp(Src) 98.7 F (37.1 C) (Oral)  Ht  (1.727 m)  Wt 177 lb 9.6 oz (80.559 kg)  BMI 27.01 kg/m2  Wt Readings from Last 3 Encounters:  09/17/15 177 lb 9.6 oz (80.559 kg)  08/15/15 173 lb 12.8 oz (78.835 kg)  07/31/15 175 lb 6.4 oz (79.561 kg)    Physical Exam  Constitutional: He is oriented to person, place, and time. He appears well-developed and well-nourished. No distress.  Eyes: Conjunctivae and EOM are normal. Pupils  are equal, round, and reactive to light. Right eye exhibits no discharge. No scleral icterus.  Neck: Neck supple. No thyromegaly present.  Cardiovascular: Normal rate, regular rhythm, normal heart sounds and intact distal pulses.   No murmur heard. Pulmonary/Chest: Effort normal and breath sounds normal. No respiratory distress. He has no wheezes.  Musculoskeletal: Normal range of motion. He exhibits no edema.  Lymphadenopathy:    He has no cervical adenopathy.  Neurological: He is alert and oriented to person, place, and time. Coordination normal.  Skin: Skin is warm and dry. No rash noted. He is not diaphoretic.  Psychiatric: His behavior is normal. Judgment and thought content  normal. His mood appears anxious. He exhibits a depressed mood. He expresses no suicidal ideation. He expresses no suicidal plans.  Vitals reviewed.     Assessment & Plan:   Problem List Items Addressed This Visit      Other   Anxiety and depression - Primary    Much improved but still having some issues with it. Since Cymbalta helped a much we will try another medication in the class, will send Effexor.      Relevant Medications   venlafaxine XR (EFFEXOR-XR) 150 MG 24 hr capsule       Follow up plan: Return in about 4 weeks (around 10/15/2015), or if symptoms worsen or fail to improve, for Recheck anxiety and depression.  Counseling provided for all of the vaccine components No orders of the defined types were placed in this encounter.    Arville CareJoshua Dettinger, MD [facility] 09/17/2015, 12:41 PM

## 2015-09-17 NOTE — Assessment & Plan Note (Signed)
Much improved but still having some issues with it. Since Cymbalta helped a much we will try another medication in the class, will send Effexor.

## 2015-09-22 ENCOUNTER — Encounter: Payer: Self-pay | Admitting: Family Medicine

## 2015-11-04 ENCOUNTER — Encounter: Payer: Self-pay | Admitting: Family Medicine

## 2015-11-04 ENCOUNTER — Ambulatory Visit (INDEPENDENT_AMBULATORY_CARE_PROVIDER_SITE_OTHER): Payer: BLUE CROSS/BLUE SHIELD | Admitting: Family Medicine

## 2015-11-04 VITALS — BP 127/79 | HR 82 | Temp 98.2°F | Ht 68.0 in | Wt 183.6 lb

## 2015-11-04 DIAGNOSIS — F419 Anxiety disorder, unspecified: Principal | ICD-10-CM

## 2015-11-04 DIAGNOSIS — F32A Depression, unspecified: Secondary | ICD-10-CM

## 2015-11-04 DIAGNOSIS — F418 Other specified anxiety disorders: Secondary | ICD-10-CM | POA: Diagnosis not present

## 2015-11-04 DIAGNOSIS — F329 Major depressive disorder, single episode, unspecified: Secondary | ICD-10-CM

## 2015-11-04 MED ORDER — VENLAFAXINE HCL ER 150 MG PO CP24: 150.0000 mg | ORAL_CAPSULE | Freq: Every day | ORAL | 3 refills | Status: DC

## 2015-11-04 MED ORDER — BUSPIRONE HCL 7.5 MG PO TABS: 7.5000 mg | ORAL_TABLET | Freq: Two times a day (BID) | ORAL | 2 refills | Status: AC

## 2015-11-04 NOTE — Progress Notes (Signed)
BP 127/79 (BP Location: Right Arm, Patient Position: Sitting, Cuff Size: Large)   Pulse 82   Temp 98.2 F (36.8 C) (Oral)   Ht  (1.727 m)   Wt 183 lb 9.6 oz (83.3 kg)   BMI 27.92 kg/m    Subjective:    Patient ID: Jordan Hunt, male    DOB: 21-Apr-1985, 30 y.o.   MRN: [id number]  HPI: Jordan Hunt is a 30 y.o. male presenting on 11/04/2015 for Depression (followup) and Anxiety   HPI Depression and anxiety  Patient comes in for depression and anxiety. He was started on Effexor last time he was here. He says the Effexor is doing very well for him. He said he rarely has anxiety build up episodes like he was having before. He also takes the BuSpar once a day now and feels like he is doing very well on it. He is still having issues with his work and does not like his workplace. He is looking into finding another job or position somewhere else. He is very happy with his home life with his new daughter and his wife. He says his relationship is doing very well and denies any major issues there he denies any suicidal ideations or thoughts of hurting himself.  Relevant past medical, surgical, family and social history reviewed and updated as indicated. Interim medical history since our last visit reviewed. Allergies and medications reviewed and updated.  Review of Systems  Constitutional: Negative for fever.  HENT: Negative for ear discharge and ear pain.   Eyes: Negative for discharge and visual disturbance.  Respiratory: Negative for shortness of breath and wheezing.   Cardiovascular: Negative for chest pain and leg swelling.  Gastrointestinal: Negative for abdominal pain, constipation and diarrhea.  Genitourinary: Negative for difficulty urinating.  Musculoskeletal: Negative for back pain and gait problem.  Skin: Negative for rash.  Neurological: Negative for syncope, light-headedness and headaches.  Psychiatric/Behavioral: Positive for dysphoric mood. Negative for self-injury,  sleep disturbance and suicidal ideas. The patient is nervous/anxious.   All other systems reviewed and are negative.   Per HPI unless specifically indicated above     Medication List       Accurate as of 11/04/15  8:54 AM. Always use your most recent med list.          busPIRone 7.5 MG tablet Commonly known as:  BUSPAR Take 1 tablet (7.5 mg total) by mouth 2 (two) times daily.   venlafaxine XR 150 MG 24 hr capsule Commonly known as:  EFFEXOR-XR Take 1 capsule (150 mg total) by mouth daily with breakfast.          Objective:    BP 127/79 (BP Location: Right Arm, Patient Position: Sitting, Cuff Size: Large)   Pulse 82   Temp 98.2 F (36.8 C) (Oral)   Ht  (1.727 m)   Wt 183 lb 9.6 oz (83.3 kg)   BMI 27.92 kg/m   Wt Readings from Last 3 Encounters:  11/04/15 183 lb 9.6 oz (83.3 kg)  09/17/15 177 lb 9.6 oz (80.6 kg)  08/15/15 173 lb 12.8 oz (78.8 kg)    Physical Exam  Constitutional: He is oriented to person, place, and time. He appears well-developed and well-nourished. No distress.  Eyes: Conjunctivae and EOM are normal. Pupils are equal, round, and reactive to light. Right eye exhibits no discharge. No scleral icterus.  Neck: Neck supple. No thyromegaly present.  Cardiovascular: Normal rate, regular rhythm, normal heart sounds and intact distal pulses.  No murmur heard. Pulmonary/Chest: Effort normal and breath sounds normal. No respiratory distress. He has no wheezes.  Musculoskeletal: Normal range of motion. He exhibits no edema.  Lymphadenopathy:    He has no cervical adenopathy.  Neurological: He is alert and oriented to person, place, and time. Coordination normal.  Skin: Skin is warm and dry. No rash noted. He is not diaphoretic.  Psychiatric: His behavior is normal. Judgment normal. His mood appears anxious. He exhibits a depressed mood. He expresses no suicidal ideation. He expresses no suicidal plans.  Nursing note and vitals reviewed.        Assessment & Plan:   Problem List Items Addressed This Visit      Other   Anxiety and depression - Primary   Relevant Medications   venlafaxine XR (EFFEXOR-XR) 150 MG 24 hr capsule   busPIRone (BUSPAR) 7.5 MG tablet    Other Visit Diagnoses   None.      Follow up plan: Return in about 3 months (around 02/04/2016), or if symptoms worsen or fail to improve, for Recheck anxiety and depression.  Counseling provided for all of the vaccine components No orders of the defined types were placed in this encounter.   Arville Care, MD [facility] 11/04/2015, 8:54 AM

## 2016-01-01 ENCOUNTER — Encounter: Payer: Self-pay | Admitting: Family Medicine

## 2016-01-01 ENCOUNTER — Ambulatory Visit (INDEPENDENT_AMBULATORY_CARE_PROVIDER_SITE_OTHER): Payer: BLUE CROSS/BLUE SHIELD | Admitting: Family Medicine

## 2016-01-01 VITALS — BP 131/82 | HR 63 | Temp 98.2°F | Ht 68.0 in | Wt 180.5 lb

## 2016-01-01 DIAGNOSIS — T25221A Burn of second degree of right foot, initial encounter: Secondary | ICD-10-CM

## 2016-01-01 NOTE — Progress Notes (Signed)
BP 131/82   Pulse 63   Temp 98.2 F (36.8 C) (Oral)   Ht 5\' 8"  (1.727 m)   Wt 180 lb 8 oz (81.9 kg)   BMI 27.44 kg/m    Subjective:    Patient ID: Jordan Hunt, male    DOB: Oct 06, 1985, 29 y.o.   MRN: [id number]  HPI: Jordan Hunt is a 30 y.o. male presenting on 01/01/2016 for Right foot pain and swelling (stepped on a piece of charcoal on Sunday)   HPI Right foot pain and burn Patient comes in today because of right foot pain and a burn on the bottom of his right foot. 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 Since then overnight especially his pain has increased significantly. He did develop a blister there and that has since popped. He denies any redness or warmth or purulent drainage. He is having some clear drainage from it. He is having swelling in his foot around it but denies any swelling going anywhere else. He denies any tingling or numbness going down into his toes.  Relevant past medical, surgical, family and social history reviewed and updated as indicated. Interim medical history since our last visit reviewed. Allergies and medications reviewed and updated.  Review of Systems  Constitutional: Negative for chills and fever.  Eyes: Negative for discharge.  Respiratory: Negative for shortness of breath and wheezing.   Cardiovascular: Negative for chest pain and leg swelling.  Musculoskeletal: Negative for back pain and gait problem.  Skin: Positive for color change and wound. Negative for rash.  All other systems reviewed and are negative.   Per HPI unless specifically indicated above     Medication List       Accurate as of 01/01/16  9:26 AM. Always use your most recent med list.          busPIRone 7.5 MG tablet Commonly known as:  BUSPAR Take 1 tablet (7.5 mg total) by mouth 2 (two) times daily.   venlafaxine XR 150 MG 24 hr capsule Commonly known as:   EFFEXOR-XR Take 1 capsule (150 mg total) by mouth daily with breakfast.          Objective:    BP 131/82   Pulse 63   Temp 98.2 F (36.8 C) (Oral)   Ht 5\' 8"  (1.727 m)   Wt 180 lb 8 oz (81.9 kg)   BMI 27.44 kg/m   Wt Readings from Last 3 Encounters:  01/01/16 180 lb 8 oz (81.9 kg)  11/04/15 183 lb 9.6 oz (83.3 kg)  09/17/15 177 lb 9.6 oz (80.6 kg)    Physical Exam  Constitutional: He appears well-developed and well-nourished. No distress.  Eyes: Conjunctivae are normal.  Neurological: He is alert. Coordination normal.  Skin: Skin is warm and dry. Burn (Patient has a 2 cm wide area on the middle of his plantar surface on his right foot that is a blister that has popped. There is straw-colored drainage out of it. No surrounding erythema or warmth. He does have some swelling in the surrounding foot) noted. He is not diaphoretic. No erythema.      Assessment & Plan:   Problem List Items Addressed This Visit    None    Visit Diagnoses    Second degree burn of foot, right, initial encounter    -  Primary   Recommended topical triple antibiotic and Xeroform gauze. Change  every 12-24 hours, keep moist and watch for signs of infection.       Follow up plan: Return if symptoms worsen or fail to improve.  Counseling provided for all of the vaccine components No orders of the defined types were placed in this encounter.   Arville CareJoshua Dettinger, MD [facility] 01/01/2016, 9:26 AM

## 2016-02-18 ENCOUNTER — Telehealth: Payer: Self-pay | Admitting: Family Medicine

## 2016-02-18 DIAGNOSIS — F419 Anxiety disorder, unspecified: Principal | ICD-10-CM

## 2016-02-18 DIAGNOSIS — F329 Major depressive disorder, single episode, unspecified: Secondary | ICD-10-CM

## 2016-02-18 DIAGNOSIS — F32A Depression, unspecified: Secondary | ICD-10-CM

## 2016-02-18 MED ORDER — VENLAFAXINE HCL ER 150 MG PO CP24: 150.0000 mg | ORAL_CAPSULE | Freq: Every day | ORAL | 3 refills | Status: AC

## 2016-02-18 NOTE — Telephone Encounter (Signed)
Orders placed.   Murtis SinkSam Goku Harb, MD [facility] 02/18/2016, 10:29 AM

## 2016-02-18 NOTE — Telephone Encounter (Signed)
Last office visit 01-01-16.  Please advise on refills for Effexor.

## 2016-02-18 NOTE — Telephone Encounter (Signed)
Aware, refills done. 

## 2016-02-24 ENCOUNTER — Ambulatory Visit: Payer: BLUE CROSS/BLUE SHIELD | Admitting: Family Medicine

## 2017-03-10 IMAGING — CR DG CHEST 2V
2 series · 2 of 2 positions shown · non-contrast
Comparison: None.

CLINICAL DATA: Labored breathing and chest pressure, onset tonight.
Previously seen on 08/13/2014 for similar symptoms.

EXAM:
CHEST  2 VIEW

[w chest pa]
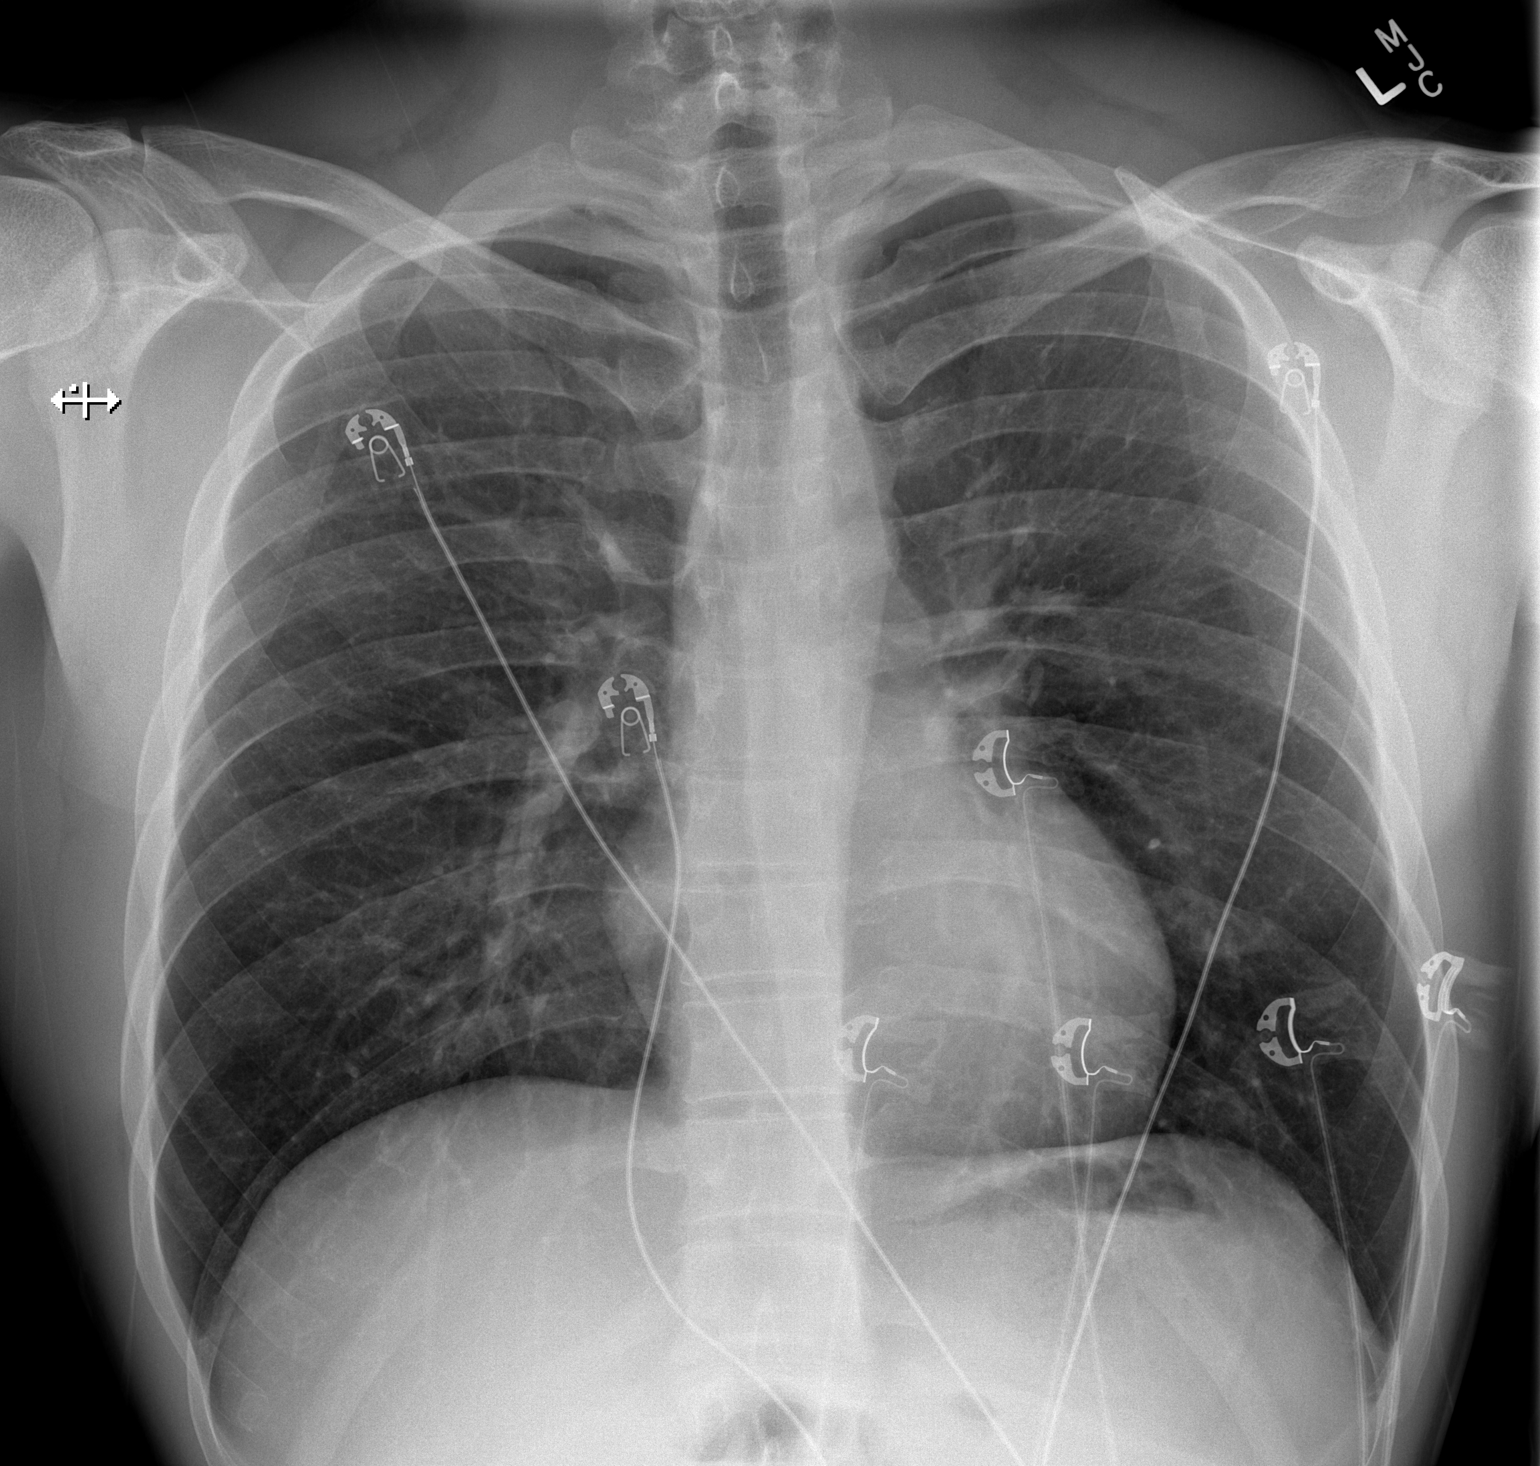

[w chest lat]
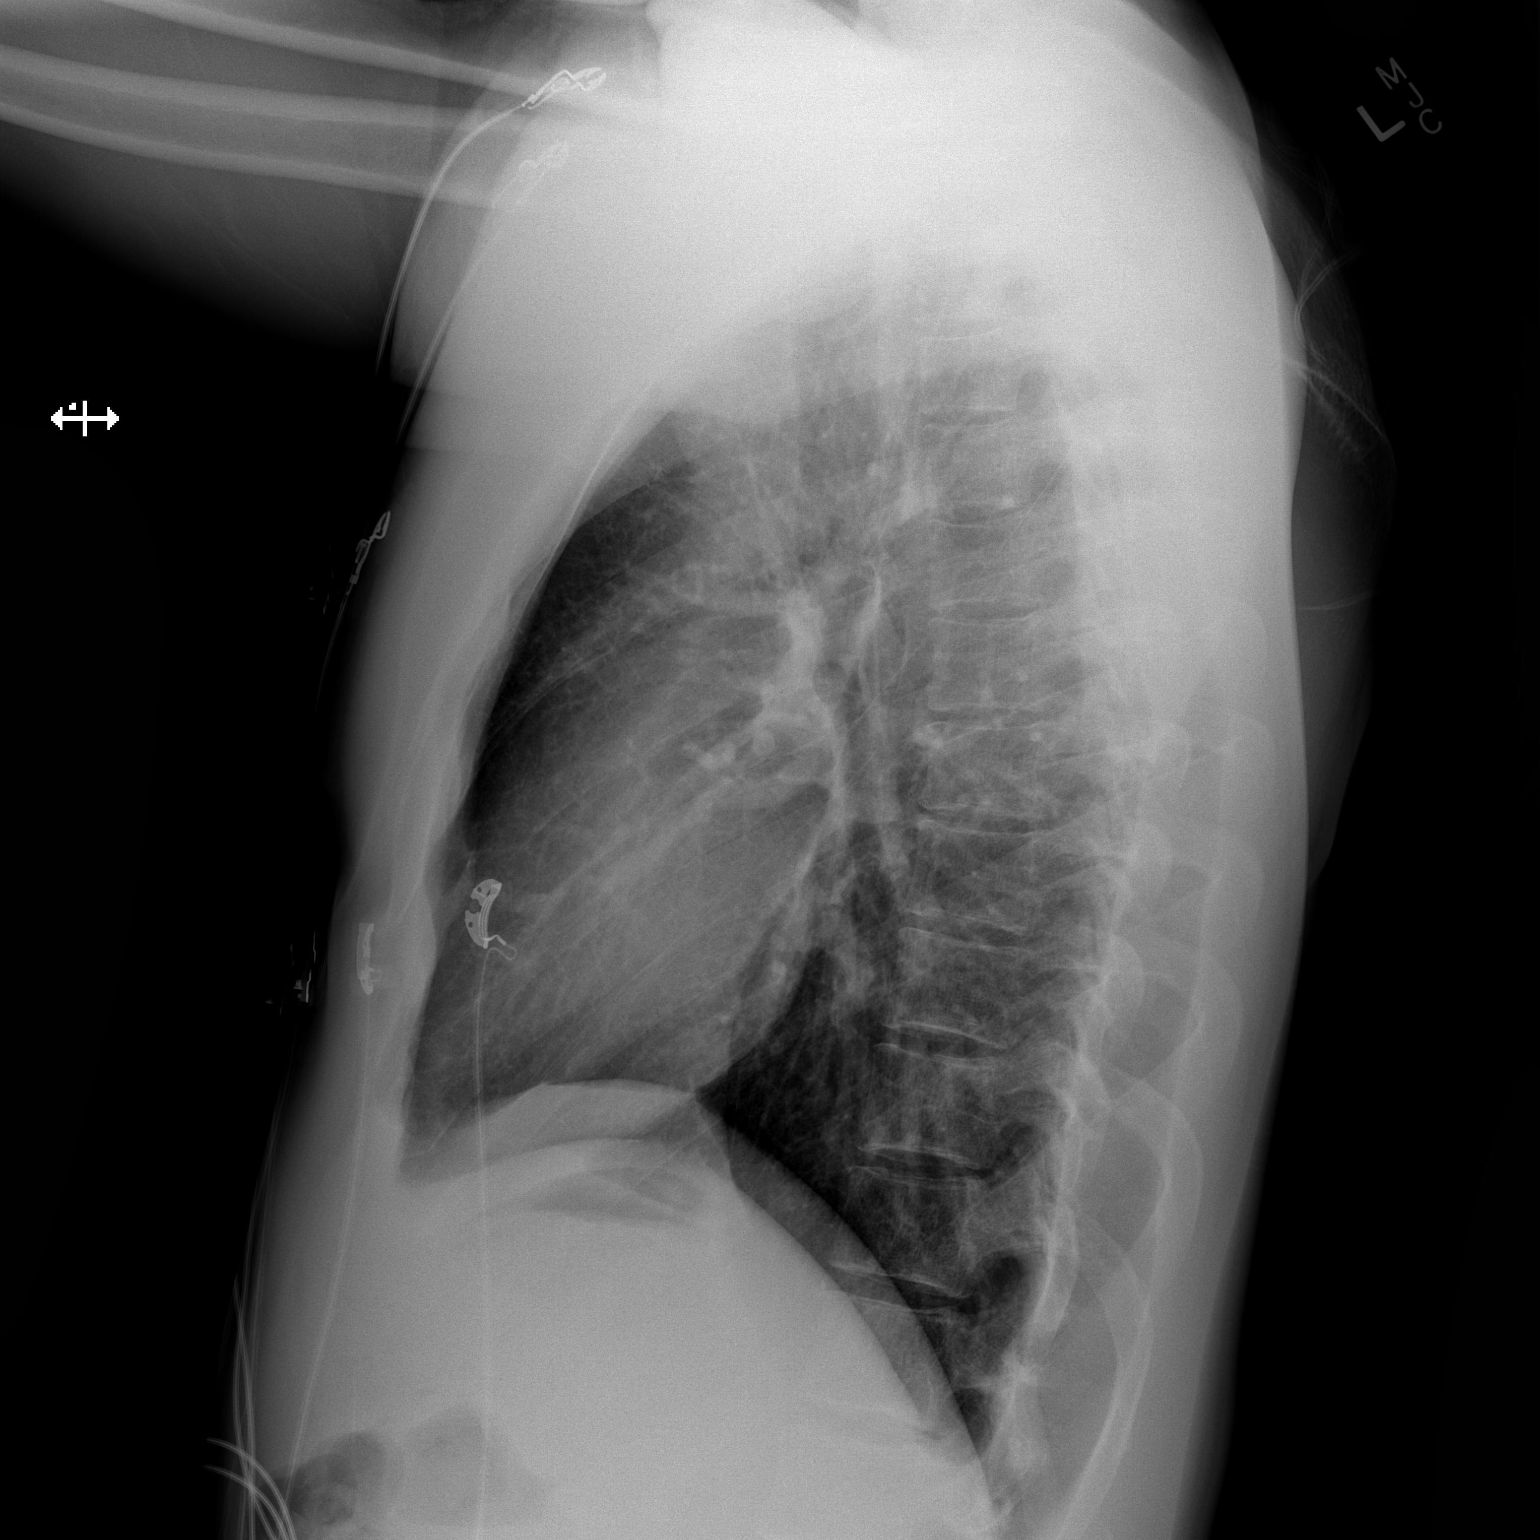

[2 of 2 positions shown; findings below may reference images not displayed]

FINDINGS: The heart size and mediastinal contours are within normal limits.
Both lungs are clear. The visualized skeletal structures are
unremarkable.
IMPRESSION: No active cardiopulmonary disease.
